# Patient Record
Sex: Female | Born: 1970 | Race: White | Hispanic: No | Marital: Married | Smoking: Never smoker
Health system: Southern US, Community
[De-identification: ages and names within clinical notes are randomized; demographics above are authoritative.]

## PROBLEM LIST (undated history)

## (undated) DIAGNOSIS — R112 Nausea with vomiting, unspecified: Secondary | ICD-10-CM

## (undated) DIAGNOSIS — J45909 Unspecified asthma, uncomplicated: Secondary | ICD-10-CM

## (undated) DIAGNOSIS — Z9889 Other specified postprocedural states: Secondary | ICD-10-CM

## (undated) DIAGNOSIS — C801 Malignant (primary) neoplasm, unspecified: Secondary | ICD-10-CM

## (undated) HISTORY — DX: Nausea with vomiting, unspecified: R11.2

## (undated) HISTORY — DX: Malignant (primary) neoplasm, unspecified: C80.1

## (undated) HISTORY — PX: APPENDECTOMY: SHX54

## (undated) HISTORY — DX: Other specified postprocedural states: Z98.890

## (undated) HISTORY — DX: Unspecified asthma, uncomplicated: J45.909

---

## 1998-05-17 ENCOUNTER — Other Ambulatory Visit: Admission: RE | Admit: 1998-05-17 | Discharge: 1998-05-17 | Payer: Self-pay | Admitting: Obstetrics & Gynecology

## 1999-09-12 ENCOUNTER — Other Ambulatory Visit: Admission: RE | Admit: 1999-09-12 | Discharge: 1999-09-12 | Payer: Self-pay | Admitting: Obstetrics & Gynecology

## 2000-12-10 ENCOUNTER — Other Ambulatory Visit: Admission: RE | Admit: 2000-12-10 | Discharge: 2000-12-10 | Payer: Self-pay | Admitting: Obstetrics and Gynecology

## 2001-01-20 ENCOUNTER — Ambulatory Visit (HOSPITAL_COMMUNITY): Admission: RE | Admit: 2001-01-20 | Discharge: 2001-01-20 | Payer: Self-pay | Admitting: Obstetrics and Gynecology

## 2001-12-30 ENCOUNTER — Other Ambulatory Visit: Admission: RE | Admit: 2001-12-30 | Discharge: 2001-12-30 | Payer: Self-pay | Admitting: Obstetrics and Gynecology

## 2003-07-20 ENCOUNTER — Other Ambulatory Visit: Admission: RE | Admit: 2003-07-20 | Discharge: 2003-07-20 | Payer: Self-pay | Admitting: Obstetrics and Gynecology

## 2004-10-24 ENCOUNTER — Other Ambulatory Visit: Admission: RE | Admit: 2004-10-24 | Discharge: 2004-10-24 | Payer: Self-pay | Admitting: Obstetrics and Gynecology

## 2005-12-17 ENCOUNTER — Other Ambulatory Visit: Admission: RE | Admit: 2005-12-17 | Discharge: 2005-12-17 | Payer: Self-pay | Admitting: Obstetrics and Gynecology

## 2013-11-07 ENCOUNTER — Other Ambulatory Visit: Payer: Self-pay | Admitting: Obstetrics and Gynecology

## 2013-11-07 DIAGNOSIS — R928 Other abnormal and inconclusive findings on diagnostic imaging of breast: Secondary | ICD-10-CM

## 2013-11-21 ENCOUNTER — Ambulatory Visit
Admission: RE | Admit: 2013-11-21 | Discharge: 2013-11-21 | Disposition: A | Discharge status: Home / Self Care | Payer: No Typology Code available for payment source | Source: Ambulatory Visit | Attending: Obstetrics and Gynecology | Admitting: Obstetrics and Gynecology

## 2013-11-21 DIAGNOSIS — R928 Other abnormal and inconclusive findings on diagnostic imaging of breast: Secondary | ICD-10-CM

## 2015-03-03 IMAGING — MG MM DIAGNOSTIC UNILATERAL R
2 series · 2 of 2 positions shown · non-contrast
Comparison: 10/30/2013 and earlier priors

CLINICAL DATA: Possible mass right breast identified on recent
screening mammogram.

EXAM:
DIGITAL DIAGNOSTIC  RIGHT MAMMOGRAM WITH CAD

[R CC]
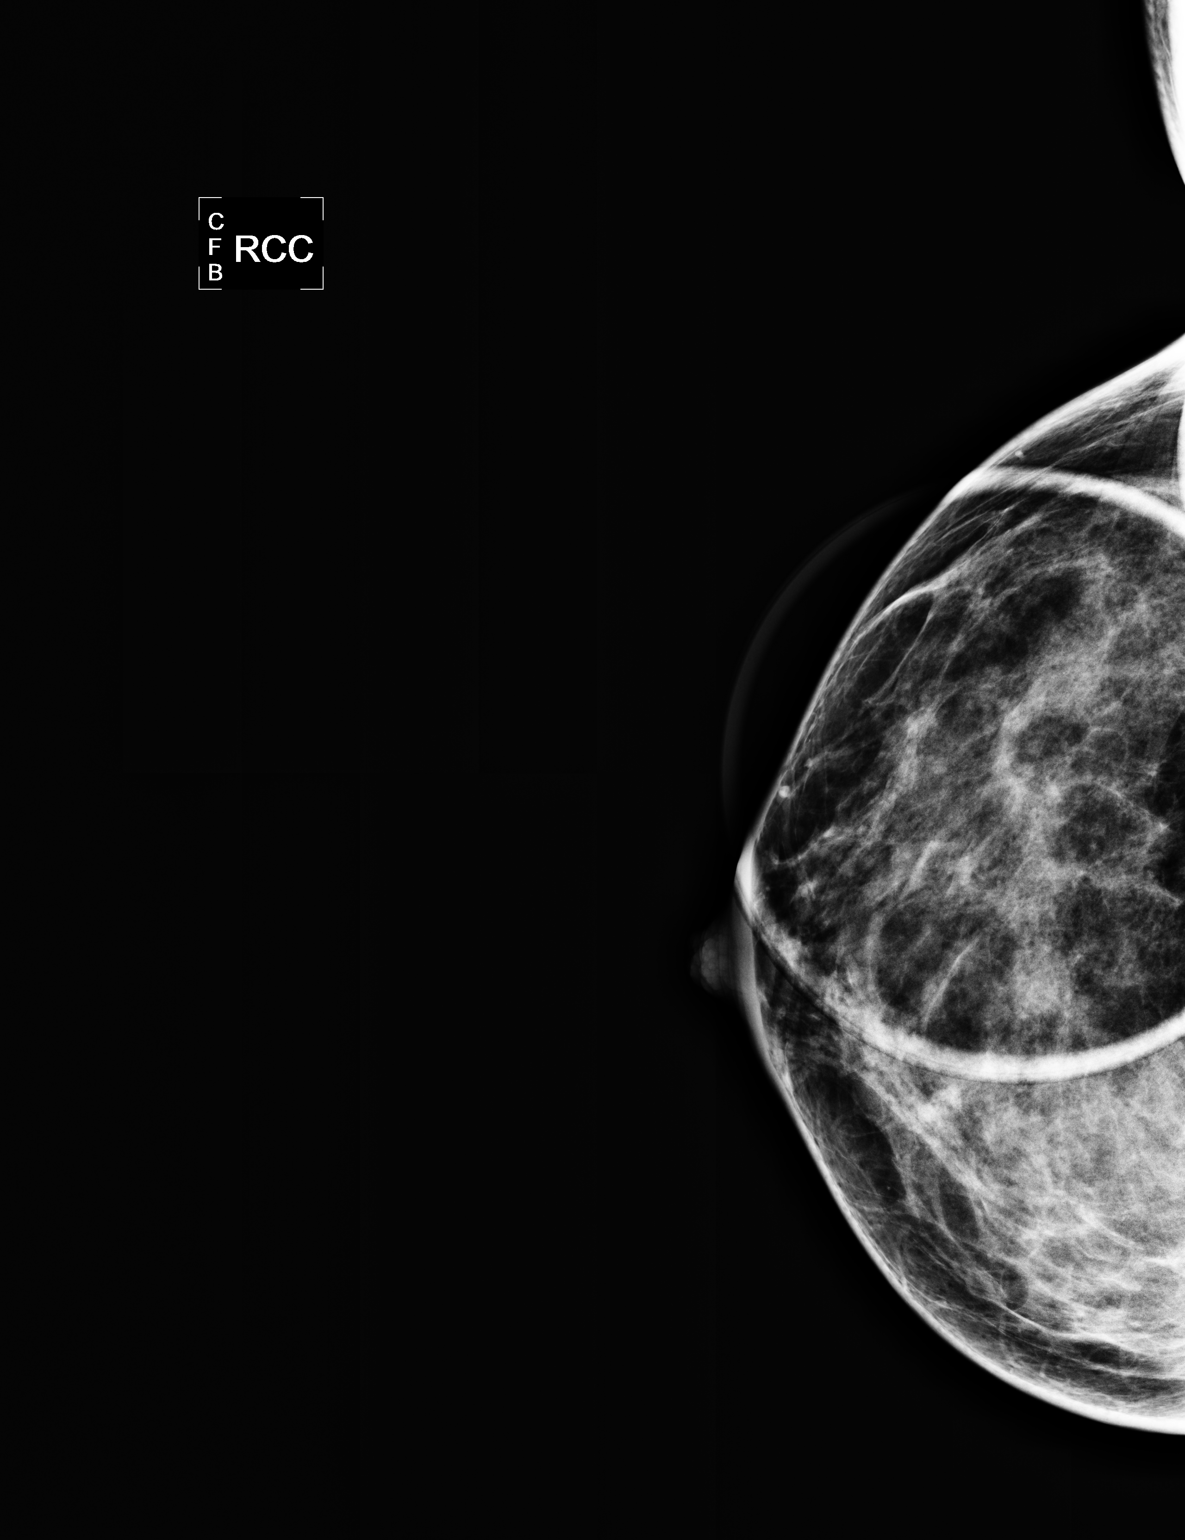

[R MLO]
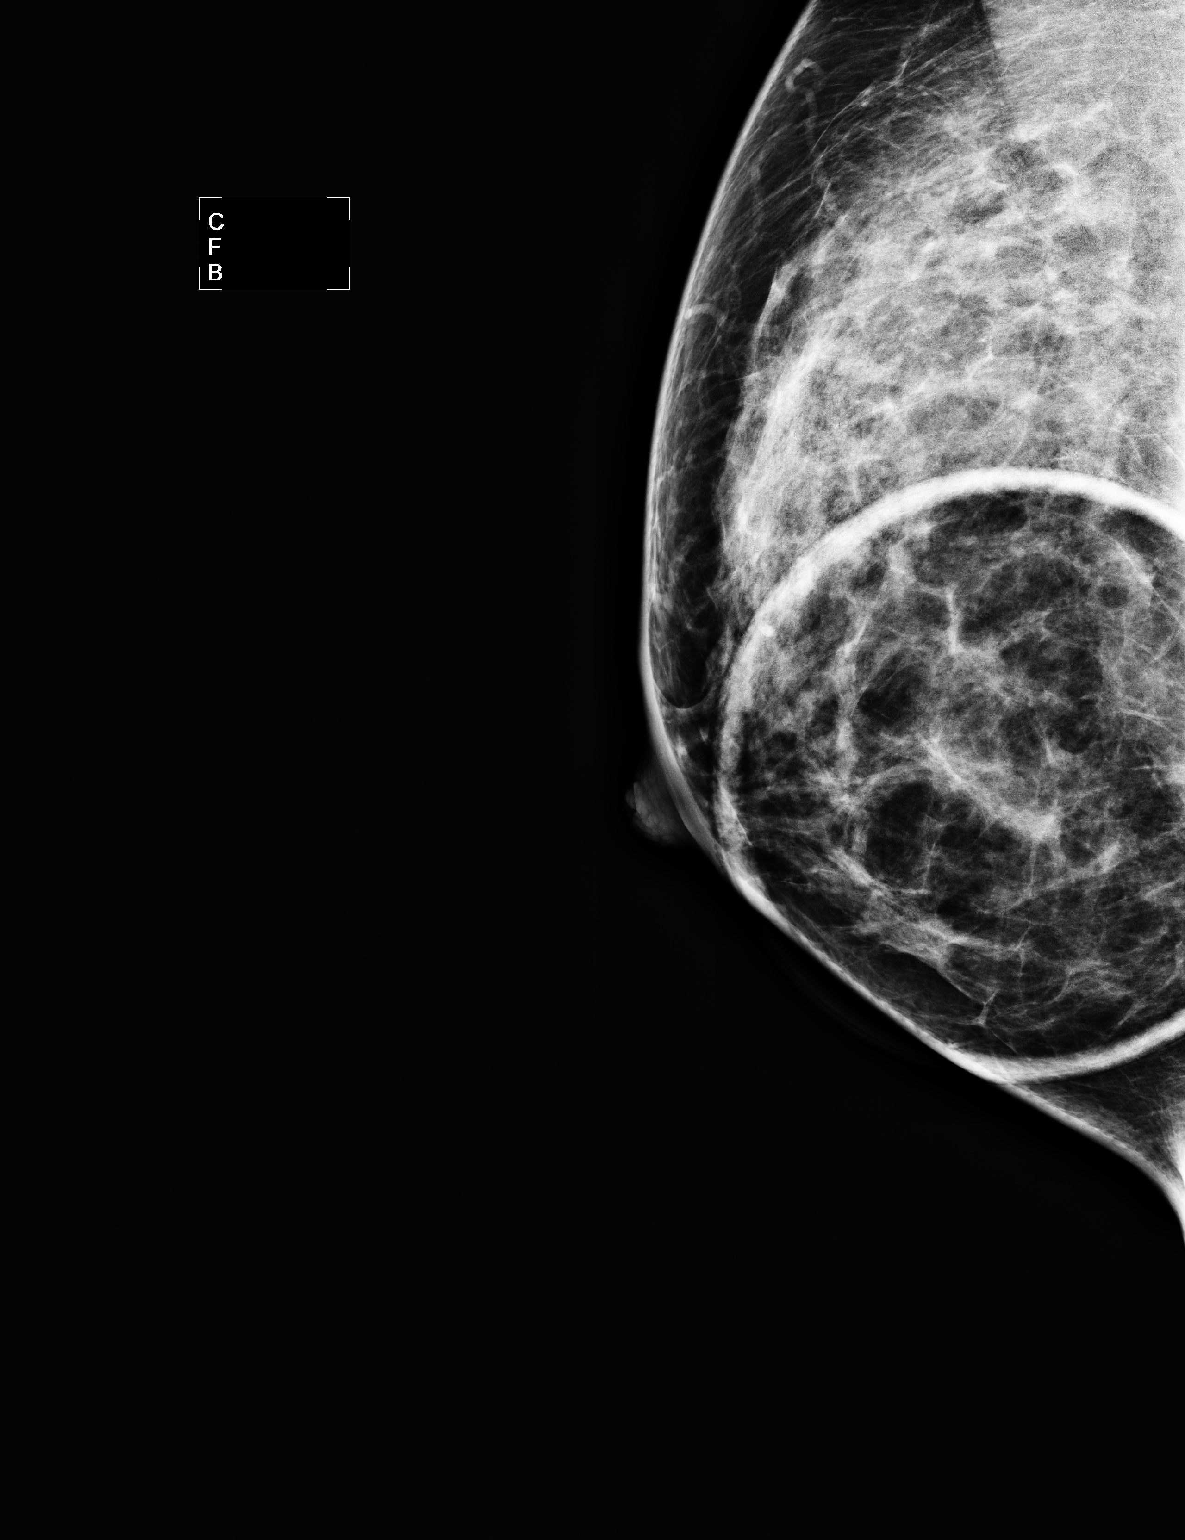

[2 of 2 positions shown; findings below may reference images not displayed]

ACR Breast Density Category c: The breast tissue is heterogeneously
dense, which may obscure small masses.
FINDINGS: Focal spot compression views of the lower outer right breast show
dispersion of fibroglandular breast tissue. There is no evidence of
persistent mass or distortion. The parenchymal pattern in this
region appears similar to prior mammograms.

Mammographic images were processed with CAD.
IMPRESSION: No evidence of malignancy in the right breast.

RECOMMENDATION:
Screening mammogram in one year.(Code:V4-Y-K90)

I have discussed the findings and recommendations with the patient.
Results were also provided in writing at the conclusion of the
visit. If applicable, a reminder letter will be sent to the patient
regarding the next appointment.

BI-RADS CATEGORY  1: Negative

## 2019-10-09 ENCOUNTER — Other Ambulatory Visit: Payer: Self-pay | Admitting: Obstetrics and Gynecology

## 2019-10-09 DIAGNOSIS — R928 Other abnormal and inconclusive findings on diagnostic imaging of breast: Secondary | ICD-10-CM

## 2019-10-13 ENCOUNTER — Ambulatory Visit
Admission: RE | Admit: 2019-10-13 | Discharge: 2019-10-13 | Disposition: A | Discharge status: Home / Self Care | Payer: No Typology Code available for payment source | Source: Ambulatory Visit | Attending: Obstetrics and Gynecology | Admitting: Obstetrics and Gynecology

## 2019-10-13 ENCOUNTER — Other Ambulatory Visit: Payer: Self-pay

## 2019-10-13 DIAGNOSIS — R928 Other abnormal and inconclusive findings on diagnostic imaging of breast: Secondary | ICD-10-CM

## 2020-01-02 ENCOUNTER — Encounter: Payer: Self-pay | Admitting: Gastroenterology

## 2020-01-11 ENCOUNTER — Other Ambulatory Visit: Payer: Self-pay

## 2020-01-11 ENCOUNTER — Ambulatory Visit (AMBULATORY_SURGERY_CENTER): Payer: Self-pay | Admitting: *Deleted

## 2020-01-11 VITALS — Temp 97.5°F | Ht 66.0 in | Wt 144.0 lb

## 2020-01-11 DIAGNOSIS — Z1211 Encounter for screening for malignant neoplasm of colon: Secondary | ICD-10-CM

## 2020-01-11 MED ORDER — CLENPIQ 10-3.5-12 MG-GM -GM/160ML PO SOLN: 1.0000 | ORAL | 0 refills | Status: DC

## 2020-01-11 NOTE — Progress Notes (Signed)
Patient is here in-person for PV. Patient denies any allergies to eggs or soy. Patient denies any problems with anesthesia/sedation. Patient denies any oxygen use at home. Patient denies taking any diet/weight loss medications or blood thinners. Patient is not being treated for MRSA or C-diff.  Pt is aware that care partner will wait in the car during procedure; if they feel like they will be too hot or cold to wait in the car; they may wait in the 4 th floor lobby. Patient is aware to bring only one care partner. We want them to wear a mask (we do not have any that we can provide them), practice social distancing, and we will check their temperatures when they get here.  I did remind the patient that their care partner needs to stay in the parking lot the entire time and have a cell phone available, we will call them when the pt is ready for discharge. Patient will wear mask into building.    Patient has had both vaccines Covid 2nd dose on 12/10/2019.  Clenpiq coupon given to the patient.

## 2020-01-12 ENCOUNTER — Encounter: Payer: No Typology Code available for payment source | Admitting: Gastroenterology

## 2020-01-24 ENCOUNTER — Encounter: Payer: Self-pay | Admitting: Gastroenterology

## 2020-01-26 ENCOUNTER — Encounter: Payer: No Typology Code available for payment source | Admitting: Gastroenterology

## 2020-01-26 ENCOUNTER — Ambulatory Visit (AMBULATORY_SURGERY_CENTER): Payer: No Typology Code available for payment source | Admitting: Gastroenterology

## 2020-01-26 ENCOUNTER — Encounter: Payer: Self-pay | Admitting: Gastroenterology

## 2020-01-26 ENCOUNTER — Other Ambulatory Visit: Payer: Self-pay

## 2020-01-26 VITALS — BP 107/78 | HR 96 | Temp 97.1°F | Resp 20 | Ht 66.0 in | Wt 144.0 lb

## 2020-01-26 DIAGNOSIS — K626 Ulcer of anus and rectum: Secondary | ICD-10-CM | POA: Diagnosis not present

## 2020-01-26 DIAGNOSIS — Z1211 Encounter for screening for malignant neoplasm of colon: Secondary | ICD-10-CM

## 2020-01-26 DIAGNOSIS — D128 Benign neoplasm of rectum: Secondary | ICD-10-CM | POA: Diagnosis not present

## 2020-01-26 DIAGNOSIS — K6289 Other specified diseases of anus and rectum: Secondary | ICD-10-CM | POA: Diagnosis not present

## 2020-01-26 MED ORDER — SODIUM CHLORIDE 0.9 % IV SOLN: 500.0000 mL | Freq: Once | INTRAVENOUS | Status: DC

## 2020-01-26 NOTE — Progress Notes (Signed)
Pt's states no medical or surgical changes since previsit or office visit.  Temp- Bond

## 2020-01-26 NOTE — Progress Notes (Signed)
pt tolerated well. VSS. awake and to recovery. Report given to RN.  

## 2020-01-26 NOTE — Progress Notes (Signed)
Called to room to assist during endoscopic procedure.  Patient ID and intended procedure confirmed with present staff. Received instructions for my participation in the procedure from the performing physician.  

## 2020-01-26 NOTE — Op Note (Signed)
[facility] Patient Name: Alison Daniel Procedure Date: 01/26/2020 1:23 PM MRN: [id number] Endoscopist: Jackquline Denmark , MD Age: 49 Referring MD:  Date of Birth: [birth year] Gender: Female Account #: [id number] Procedure:                Colonoscopy Indications:              Screening for colorectal malignant neoplasm Medicines:                Monitored Anesthesia Care Procedure:                Pre-Anesthesia Assessment:                           - Prior to the procedure, a History and Physical                            was performed, and patient medications and                            allergies were reviewed. The patient's tolerance of                            previous anesthesia was also reviewed. The risks                            and benefits of the procedure and the sedation                            options and risks were discussed with the patient.                            All questions were answered, and informed consent                            was obtained. Prior Anticoagulants: The patient has                            taken no previous anticoagulant or antiplatelet                            agents. ASA Grade Assessment: II - A patient with                            mild systemic disease. After reviewing the risks                            and benefits, the patient was deemed in                            satisfactory condition to undergo the procedure.                           After obtaining informed consent, the colonoscope  was passed under direct vision. Throughout the                            procedure, the patient's blood pressure, pulse, and                            oxygen saturations were monitored continuously. The                            Colonoscope was introduced through the anus and                            advanced to the 4 cm into the ileum. The                            colonoscopy was performed  without difficulty. The                            patient tolerated the procedure well. The quality                            of the bowel preparation was excellent. The                            terminal ileum, ileocecal valve, appendiceal                            orifice, and rectum were photographed. Scope In: 1:43:30 PM Scope Out: 2:05:23 PM Scope Withdrawal Time: 0 hours 15 minutes 57 seconds  Total Procedure Duration: 0 hours 21 minutes 53 seconds  Findings:                 A 8 mm polyp was found in the rectum. The polyp was                            sessile. The polyp was removed with a hot snare.                            Resection and retrieval were complete.                           A few rare small-mouthed diverticula were found in                            the sigmoid colon, descending colon and ascending                            colon.                           2 "kissing" ulcers were noted in the mid rectum, 5                            cm from the dentate line, along  the posterior and                            anterior wall with minor degree of rectal stenosis.                            These ulcers were discrete, superficial and flat.                            The posterior ulcer measured 1.5 cm with clean                            whitish base. The anterior ulcer measured 1 cm. No                            rectal prolapse was noted. No masses.                            Photodocumentation was obtained. The above rectal                            polyp was located just proximal to the stenosis.                           Non-bleeding internal hemorrhoids were found during                            retroflexion. The hemorrhoids were small.                           The terminal ileum appeared normal.                           The exam was otherwise without abnormality on                            direct and retroflexion views. Complications:            No  immediate complications. Estimated Blood Loss:     Estimated blood loss: none. Impression:               -Rectal polyp s/p polypectomy.                           -Mild pancolonic diverticulosis.                           -Mild degree of rectal stenosis with superficial                            ulcers. (? Etiology. Could represent stercoral                            ulcers or solitary rectal ulcers. No masses)                            (  biopsied).                           -Non-bleeding internal hemorrhoids.                           -Otherwise normal colonoscopy to TI. Recommendation:           - Patient has a contact number available for                            emergencies. The signs and symptoms of potential                            delayed complications were discussed with the                            patient. Return to normal activities tomorrow.                            Written discharge instructions were provided to the                            patient.                           - Resume previous diet.                           - Continue present medications.                           - Use Benefiber one teaspoon PO daily.                           - Await pathology results.                           - Repeat colonoscopy for surveillance based on                            pathology results.                           - The findings and recommendations were discussed                            with the patient's husband [name].                           - FU in 74 weeks. Contact numbers were given. I                            have also given my cell No. Jackquline Denmark, MD 01/26/2020 2:21:40 PM This report has been signed electronically.

## 2020-01-26 NOTE — Patient Instructions (Signed)
Handout on polyps, hemorrhoids. Use Benefiber one teaspoon daily.    YOU HAD AN ENDOSCOPIC PROCEDURE TODAY AT [facility]:   Refer to the procedure report that was given to you for any specific questions about what was found during the examination.  If the procedure report does not answer your questions, please call your gastroenterologist to clarify.  If you requested that your care partner not be given the details of your procedure findings, then the procedure report has been included in a sealed envelope for you to review at your convenience later.  YOU SHOULD EXPECT: Some feelings of bloating in the abdomen. Passage of more gas than usual.  Walking can help get rid of the air that was put into your GI tract during the procedure and reduce the bloating. If you had a lower endoscopy (such as a colonoscopy or flexible sigmoidoscopy) you may notice spotting of blood in your stool or on the toilet paper. If you underwent a bowel prep for your procedure, you may not have a normal bowel movement for a few days.  Please Note:  You might notice some irritation and congestion in your nose or some drainage.  This is from the oxygen used during your procedure.  There is no need for concern and it should clear up in a day or so.  SYMPTOMS TO REPORT IMMEDIATELY:   Following lower endoscopy (colonoscopy or flexible sigmoidoscopy):  Excessive amounts of blood in the stool  Significant tenderness or worsening of abdominal pains  Swelling of the abdomen that is new, acute  Fever of 100F or higher   For urgent or emergent issues, a gastroenterologist can be reached at any hour by calling [phone]. Do not use MyChart messaging for urgent concerns.    DIET:  We do recommend a small meal at first, but then you may proceed to your regular diet.  Drink plenty of fluids but you should avoid alcoholic beverages for 24 hours.  ACTIVITY:  You should plan to take it easy for the rest of  today and you should NOT DRIVE or use heavy machinery until tomorrow (because of the sedation medicines used during the test).    FOLLOW UP: Our staff will call the number listed on your records 48-72 hours following your procedure to check on you and address any questions or concerns that you may have regarding the information given to you following your procedure. If we do not reach you, we will leave a message.  We will attempt to reach you two times.  During this call, we will ask if you have developed any symptoms of COVID 19. If you develop any symptoms (ie: fever, flu-like symptoms, shortness of breath, cough etc.) before then, please call [phone].  If you test positive for Covid 19 in the 2 weeks post procedure, please call and report this information to [place].    If any biopsies were taken you will be contacted by phone or by letter within the next 1-3 weeks.  Please call us at [phone] if you have not heard about the biopsies in 3 weeks.    SIGNATURES/CONFIDENTIALITY: You and/or your care partner have signed paperwork which will be entered into your electronic medical record.  These signatures attest to the fact that that the information above on your After Visit Summary has been reviewed and is understood.  Full responsibility of the confidentiality of this discharge information lies with you and/or your care-partner.

## 2020-01-30 ENCOUNTER — Telehealth: Payer: Self-pay | Admitting: *Deleted

## 2020-01-30 NOTE — Telephone Encounter (Signed)
  Follow up Call-  Call back number 01/26/2020  Post procedure Call Back phone  # [phone]  Permission to leave phone message Yes  Some recent data might be hidden     Patient questions:  Do you have a fever, pain , or abdominal swelling? Yes.   Pain Score  0 *  Have you tolerated food without any problems? No. see below  Have you been able to return to your normal activities? Yes.    Do you have any questions about your discharge instructions: Diet   No. Medications  No. Follow up visit  Yes.  answered  Do you have questions or concerns about your Care? No.  Actions: * If pain score is 4 or above: No action needed, pain <4.  1. Have you developed a fever since your procedure? no  2.   Have you had an respiratory symptoms (SOB or cough) since your procedure? no  3.   Have you tested positive for COVID 19 since your procedure no  4.   Have you had any family members/close contacts diagnosed with the COVID 19 since your procedure?  no   If yes to any of these questions please route to Joylene John, RN and Alphonsa Gin, Therapist, sports.    Pt states, "I have been sick on my stomach since last week.  This always happens to me when I switch up my diet like I did."  Denies pain or fever.  Able to keep liquids down.  Advised pt to call back if needed and understanding voiced.

## 2020-02-05 ENCOUNTER — Encounter: Payer: No Typology Code available for payment source | Admitting: Gastroenterology

## 2020-02-06 ENCOUNTER — Encounter: Payer: Self-pay | Admitting: Gastroenterology

## 2020-04-12 ENCOUNTER — Other Ambulatory Visit: Payer: Self-pay

## 2020-04-12 ENCOUNTER — Encounter: Payer: Self-pay | Admitting: Gastroenterology

## 2020-04-12 ENCOUNTER — Ambulatory Visit: Payer: No Typology Code available for payment source | Admitting: Gastroenterology

## 2020-04-12 VITALS — BP 106/72 | HR 73 | Temp 98.0°F | Ht 67.0 in | Wt 141.2 lb

## 2020-04-12 DIAGNOSIS — K626 Ulcer of anus and rectum: Secondary | ICD-10-CM

## 2020-04-12 NOTE — Patient Instructions (Addendum)
Kegel Exercises  Kegel exercises can help strengthen your pelvic floor muscles. The pelvic floor is a group of muscles that support your rectum, small intestine, and bladder. In females, pelvic floor muscles also help support the womb (uterus). These muscles help you control the flow of urine and stool. Kegel exercises are painless and simple, and they do not require any equipment. Your provider may suggest Kegel exercises to:  Improve bladder and bowel control.  Improve sexual response.  Improve weak pelvic floor muscles after surgery to remove the uterus (hysterectomy) or pregnancy (females).  Improve weak pelvic floor muscles after prostate gland removal or surgery (males). Kegel exercises involve squeezing your pelvic floor muscles, which are the same muscles you squeeze when you try to stop the flow of urine or keep from passing gas. The exercises can be done while sitting, standing, or lying down, but it is best to vary your position. Exercises How to do Kegel exercises: 1. Squeeze your pelvic floor muscles tight. You should feel a tight lift in your rectal area. If you are a female, you should also feel a tightness in your vaginal area. Keep your stomach, buttocks, and legs relaxed. 2. Hold the muscles tight for up to 10 seconds. 3. Breathe normally. 4. Relax your muscles. 5. Repeat as told by your health care provider. Repeat this exercise daily as told by your health care provider. Continue to do this exercise for at least 4-6 weeks, or for as long as told by your health care provider. You may be referred to a physical therapist who can help you learn more about how to do Kegel exercises. Depending on your condition, your health care provider may recommend:  Varying how long you squeeze your muscles.  Doing several sets of exercises every day.  Doing exercises for several weeks.  Making Kegel exercises a part of your regular exercise routine. This information is not intended  to replace advice given to you by your health care provider. Make sure you discuss any questions you have with your health care provider. Document Revised: 06/15/2018 Document Reviewed: 06/15/2018 Elsevier Patient Education  2020 [place].   Thank you,  Dr. Jackquline Denmark

## 2020-04-12 NOTE — Progress Notes (Signed)
Chief Complaint:   Referring Provider:  Louretta Shorten, MD      ASSESSMENT AND PLAN;   #1.  Rectal ulcers (neg Bx)-likely stercoral ulcers on colon 01/2020.  No obvious rectal prolapse.   #2.  H/O constipation  Plan: -Discussed kegel exrecises -Continue Benefiber 1 TBS p.o. daily with 8 ounces of water. -I have discussed extensively with the patient.  Have shown her the endoscopic pictures.  I have also gone over biopsy results in detail.  Since she is not having any problems, she would like to hold off on flexible sigmoidoscopy. -She will call us in case of any problems. -FU as needed.  Repeat routine colonoscopy in 10 years.   HPI:    Alison Daniel is a 49 y.o. female  For follow-up visit S/p colonoscopy 01/2020 which showed rectal ulcers with negative biopsies, likely stercoral ulcers.  She had mild pancolonic diverticulosis, benign colonic polyp s/p polypectomy. She has been started on Benefiber thereafter. No complaints at all. No further rectal bleeding She denies having any diarrhea or constipation.  No rectal pain. She is pleased with the progress Would like to hold off on flexible sigmoidoscopy She denies having any history of prolapse.  She did have rectal tear during first childbirth. No urinary or fecal incontinence. Past Medical History:  Diagnosis Date  . Asthma   . Cancer ([place])    skin cancer-basel cell  . Post-operative nausea and vomiting     Past Surgical History:  Procedure Laterality Date  . APPENDECTOMY    . TONSILLECTOMY  1973  . TUBAL LIGATION  1997    Family History  Problem Relation Age of Onset  . Colon polyps Sister   . Colon polyps Sister   . Colon cancer Neg Hx   . Esophageal cancer Neg Hx   . Rectal cancer Neg Hx   . Stomach cancer Neg Hx     Social History   Tobacco Use  . Smoking status: Never Smoker  . Smokeless tobacco: Never Used  Substance Use Topics  . Alcohol use: Yes    Alcohol/week: 3.0 standard drinks   Types: 3 Cans of beer per week  . Drug use: Not Currently    Current Outpatient Medications  Medication Sig Dispense Refill  . ALBUTEROL IN Inhale into the lungs.    . ALPRAZolam (XANAX) 0.25 MG tablet alprazolam 0.25 mg tablet  TAKE 1 TABLET BY MOUTH EVERY DAY AS NEEDED    . tiZANidine (ZANAFLEX) 4 MG tablet Take 4 mg by mouth at bedtime.    Colbert Coyer Min [id number] (MINERALS PO) Take 1 tablet by mouth daily.     Marland Kitchen UNABLE TO FIND Med Name: Vemma daily drink    . UNABLE TO FIND as needed. Med Name: CBD oil po     . VITAMIN D PO Take by mouth daily as needed.      No current facility-administered medications for this visit.    Allergies  Allergen Reactions  . No Known Allergies     Review of Systems:  neg     Physical Exam:    BP 106/72   Pulse 73   Temp 98 F (36.7 C)   Ht 5\' 7"  (1.702 m)   Wt 141 lb 4 oz (64.1 kg)   BMI 22.12 kg/m  Wt Readings from Last 3 Encounters:  04/12/20 141 lb 4 oz (64.1 kg)  01/26/20 144 lb (65.3 kg)  01/11/20 144 lb (65.3 kg)   Limited examination  was normal.    Carmell Austria, MD 04/12/2020, 11:01 AM  Cc: Louretta Shorten, MD

## 2021-01-22 IMAGING — MG DIGITAL DIAGNOSTIC UNILAT RIGHT W/ CAD
3 series · 3 of 3 positions shown · non-contrast
Comparison: October 02, 2019

CLINICAL DATA: 48-year-old patient recalled from recent screening
mammogram for evaluation of possible right breast calcifications.

EXAM:
DIGITAL DIAGNOSTIC RIGHT MAMMOGRAM

[R ML (1 of 2)]
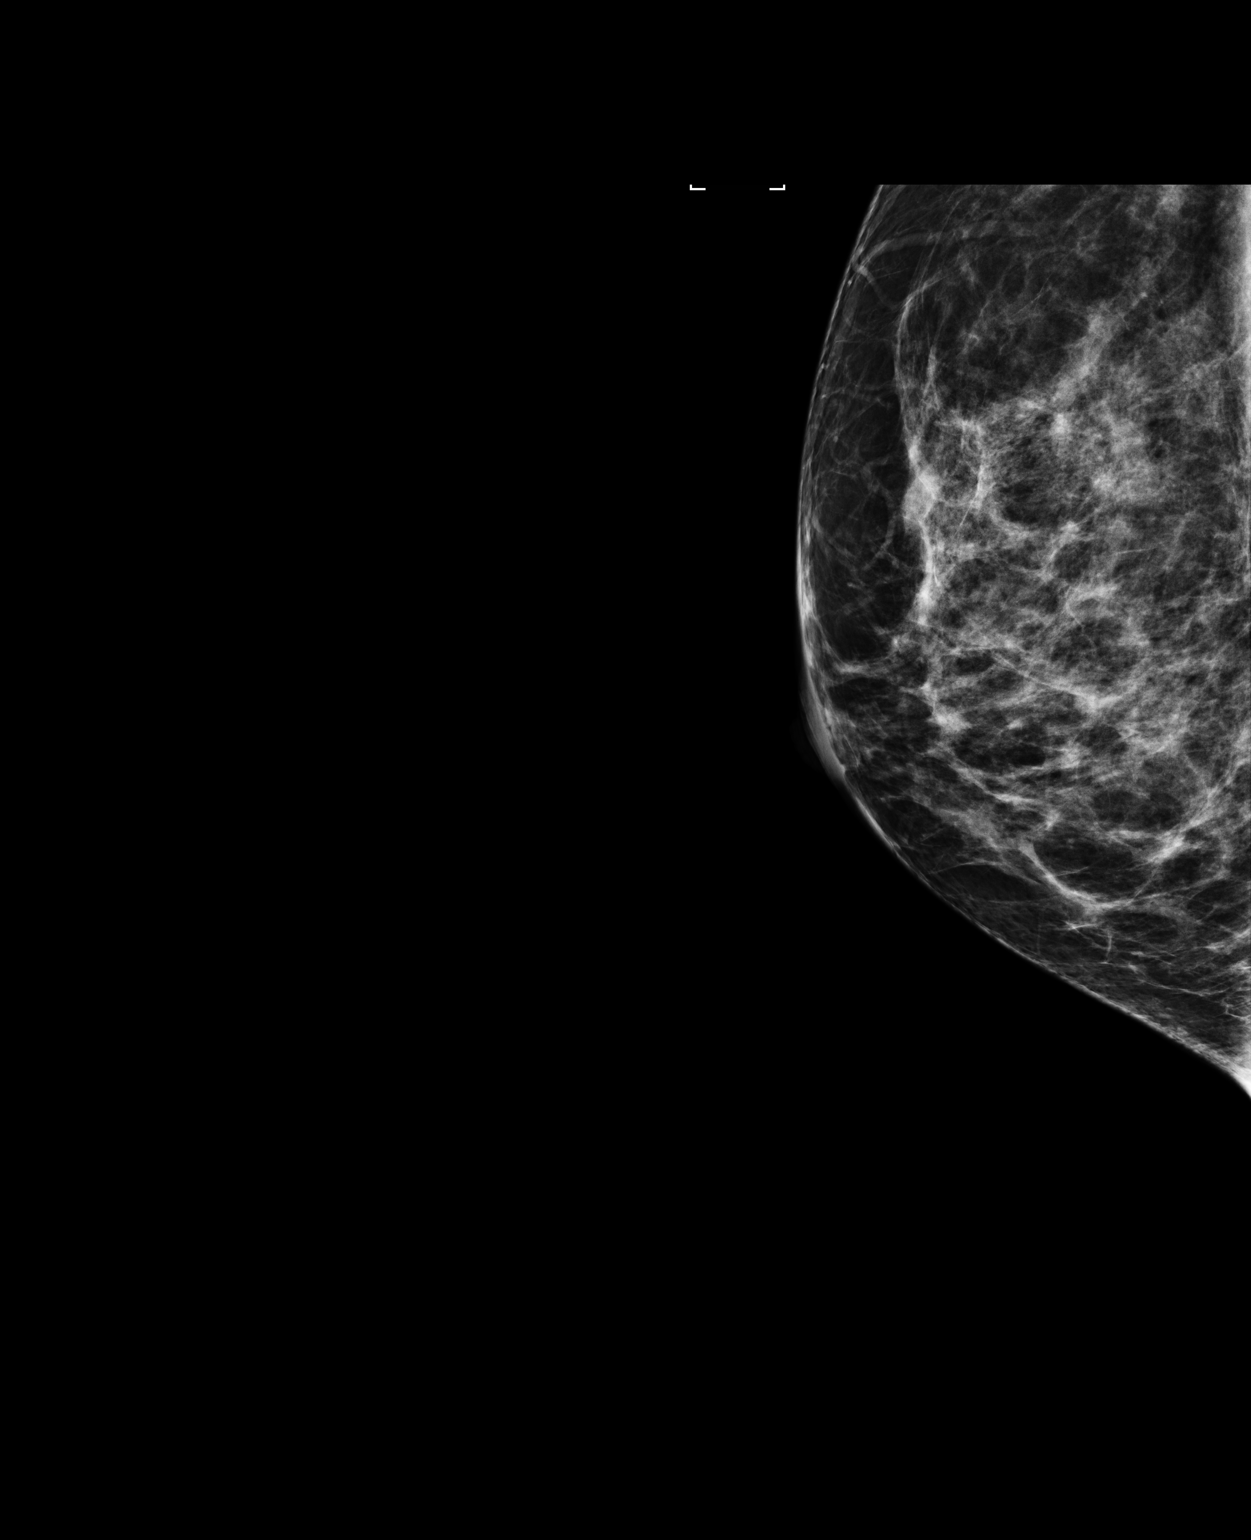

[R CC]
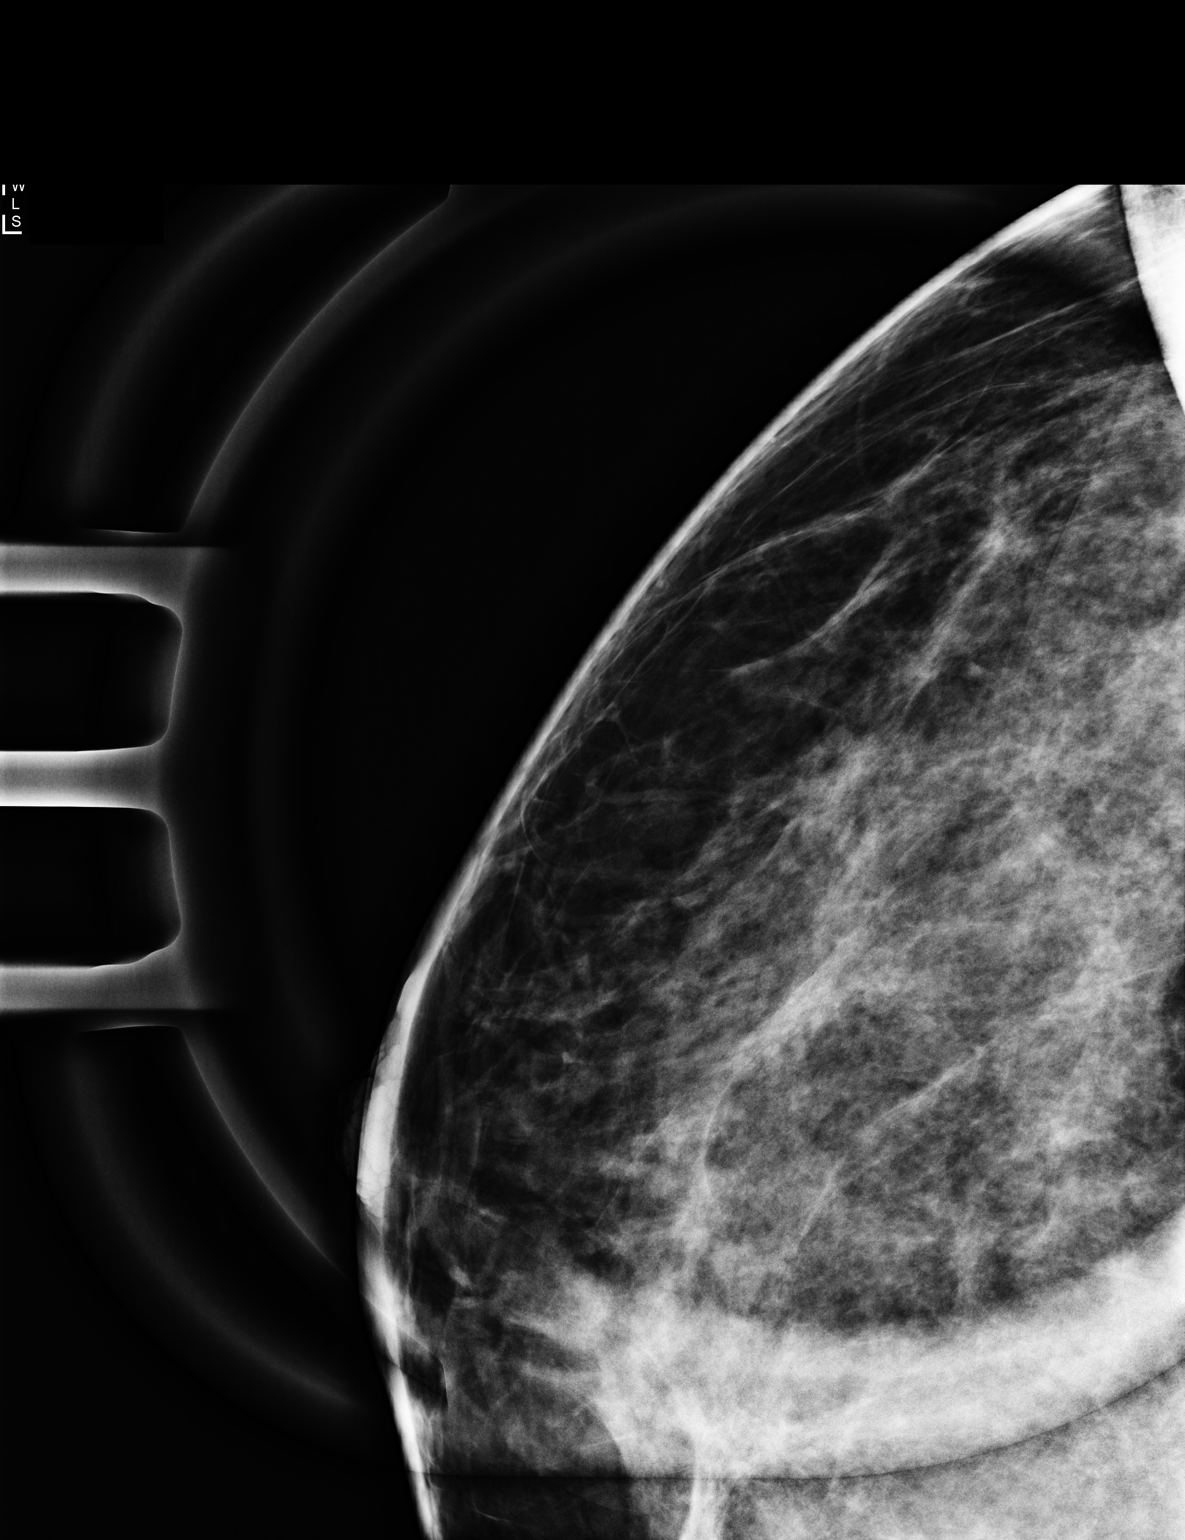

[R ML (2 of 2)]
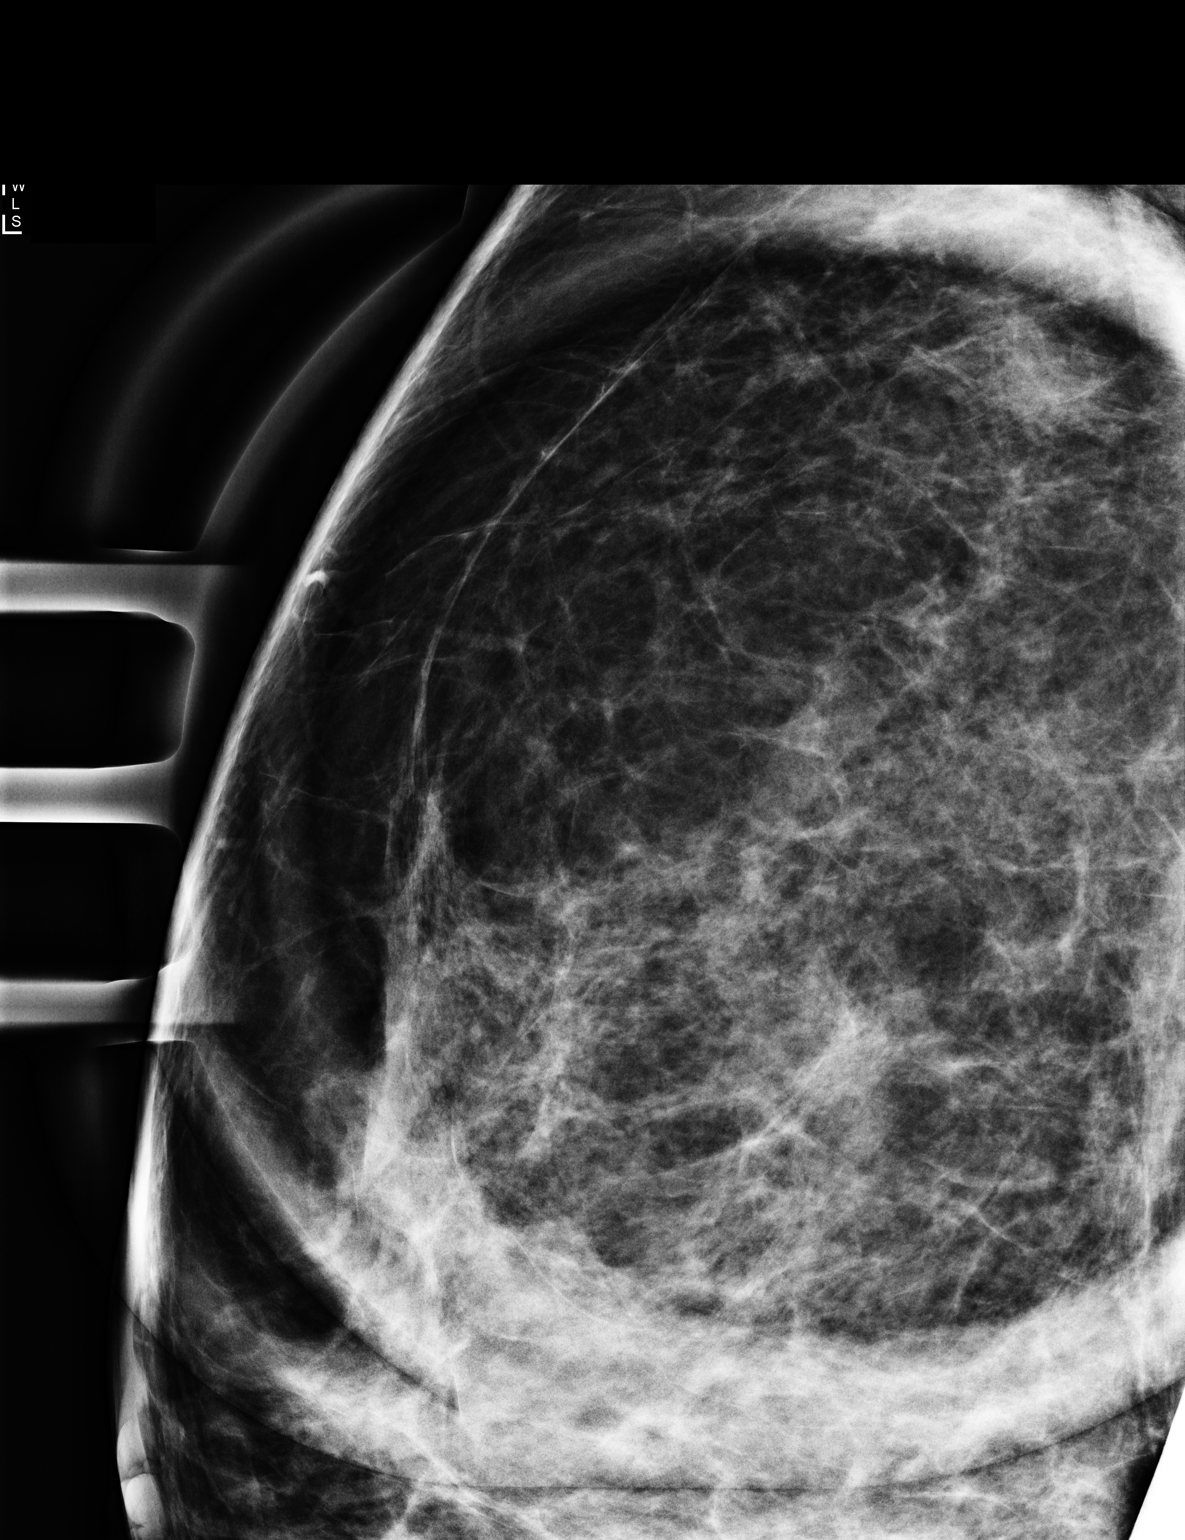

[3 of 3 positions shown; findings below may reference images not displayed]

ACR Breast Density Category c: The breast tissue is heterogeneously
dense, which may obscure small masses.
FINDINGS: Magnification views of the right breast and a 90 degree lateral view
of the right breast show no microcalcifications in the upper-outer
quadrant in the area questioned on the recent screening mammogram.
Possible calcifications seen on the reconstructed 2D images of the
screening mammogram are not confirmed on magnification views. No
suspicious findings.
IMPRESSION: No evidence of malignancy in the right breast. Negative for
microcalcifications in the upper-outer quadrant.

RECOMMENDATION:
Screening mammogram in one year.(Code:GJ-I-T10)

I have discussed the findings and recommendations with the patient.
If applicable, a reminder letter will be sent to the patient
regarding the next appointment.

BI-RADS CATEGORY  1: Negative.

## 2022-10-23 ENCOUNTER — Ambulatory Visit: Payer: BC Managed Care – PPO | Admitting: Gastroenterology

## 2022-10-23 ENCOUNTER — Encounter: Payer: Self-pay | Admitting: Gastroenterology

## 2022-10-23 VITALS — BP 116/68 | HR 78 | Ht 66.0 in | Wt 142.0 lb

## 2022-10-23 DIAGNOSIS — K626 Ulcer of anus and rectum: Secondary | ICD-10-CM

## 2022-10-23 DIAGNOSIS — Z8719 Personal history of other diseases of the digestive system: Secondary | ICD-10-CM | POA: Diagnosis not present

## 2022-10-23 NOTE — Progress Notes (Signed)
    Chief Complaint: FU  Referring Provider:  Maris Berger, MD      ASSESSMENT AND PLAN;   #1. IBS with occ diarrhea. H/O constipation in past.  #2. H/O Rectal ulcers (neg Bx)-likely stercoral ulcers on colon 01/2020.  No obvious rectal prolapse. Bx- neg. Rpt 10 yrs.  Plan:  -Imodoin AD PRN. -I have reassured her. -FU as needed.  Repeat colonoscopy only if any red flag symptoms.  Otherwise routine colonoscopy 01/2030.    HPI:    Alison Daniel is a 51 y.o. female  For follow-up visit  Had frequent stools with mild diarrhea and postprandial urgency without nocturnal symptoms, definitely exacerbated by eating sugary/fried foods and stress.  No melena or hematochezia Negative stool studies including stool for calprotectin, HP stool antigen She had normal CBC, CMP, TSH, celiac screen (except for mildly elevated IgA)  He has been taking as needed psyllium.  Feels much better now.  No weight loss.  S/p colonoscopy 01/2020 which showed rectal ulcers with negative biopsies, likely stercoral ulcers.  She had mild pancolonic diverticulosis, benign colonic polyp s/p polypectomy. Bx- neg   Past Medical History:  Diagnosis Date   Asthma    Cancer ([place])    skin cancer-basel cell   Post-operative nausea and vomiting     Past Surgical History:  Procedure Laterality Date   APPENDECTOMY     TONSILLECTOMY  1973   TUBAL LIGATION  1997    Family History  Problem Relation Age of Onset   Colon polyps Sister    Colon polyps Sister    Colon cancer Neg Hx    Esophageal cancer Neg Hx    Rectal cancer Neg Hx    Stomach cancer Neg Hx     Social History   Tobacco Use   Smoking status: Never   Smokeless tobacco: Never  Vaping Use   Vaping Use: Never used  Substance Use Topics   Alcohol use: Yes    Alcohol/week: 3.0 standard drinks of alcohol    Types: 3 Cans of beer per week   Drug use: Not Currently    Current Outpatient Medications  Medication Sig Dispense Refill    ALBUTEROL IN Inhale into the lungs.     ALPRAZolam (XANAX) 0.25 MG tablet alprazolam 0.25 mg tablet  TAKE 1 TABLET BY MOUTH EVERY DAY AS NEEDED     tiZANidine (ZANAFLEX) 4 MG tablet Take 4 mg by mouth at bedtime.     UNABLE TO FIND Med Name: Vemma daily drink     VITAMIN D PO Take by mouth daily as needed.      No current facility-administered medications for this visit.    Allergies  Allergen Reactions   No Known Allergies     Review of Systems:  neg     Physical Exam:    BP 116/68   Pulse 78   Ht '5\' 6"'$  (1.676 m)   Wt 142 lb (64.4 kg)   SpO2 98%   BMI 22.92 kg/m  Wt Readings from Last 3 Encounters:  10/23/22 142 lb (64.4 kg)  04/12/20 141 lb 4 oz (64.1 kg)  01/26/20 144 lb (65.3 kg)   Gen: awake, alert, NAD HEENT: anicteric, no pallor CV: RRR, no mrg Pulm: CTA b/l Abd: soft, NT/ND, +BS throughout Ext: no c/c/e Neuro: nonfocal  Labs were reviewed with the patient    Carmell Austria, MD 10/23/2022, 10:01 AM  Cc: Maris Berger, MD

## 2022-10-23 NOTE — Patient Instructions (Addendum)
_______________________________________________________  If you are age 53 or older, your body mass index should be between 23-30. Your Body mass index is 22.92 kg/m. If this is out of the aforementioned range listed, please consider follow up with your Primary Care Provider.  If you are age 48 or younger, your body mass index should be between 19-25. Your Body mass index is 22.92 kg/m. If this is out of the aformentioned range listed, please consider follow up with your Primary Care Provider.   ________________________________________________________  The [facility] providers would like to encourage you to use [facility] to communicate with providers for non-urgent requests or questions.  Due to long hold times on the telephone, sending your provider a message by [facility] may be a faster and more efficient way to get a response.  Please allow 48 business hours for a response.  Please remember that this is for non-urgent requests.  _______________________________________________________  Can use imodium as needed  Follow up as needed  Repeat colonoscopy due for 01-2030. Call [phone] 2 months prior to schedule this. A letter will be sent as it gets closer.  Thank you,  Dr. Jackquline Denmark

## 2024-04-27 DIAGNOSIS — Z6822 Body mass index (BMI) 22.0-22.9, adult: Secondary | ICD-10-CM | POA: Diagnosis not present

## 2024-04-27 DIAGNOSIS — N951 Menopausal and female climacteric states: Secondary | ICD-10-CM | POA: Diagnosis not present

## 2024-04-27 DIAGNOSIS — M62830 Muscle spasm of back: Secondary | ICD-10-CM | POA: Diagnosis not present

## 2024-04-27 DIAGNOSIS — Z1231 Encounter for screening mammogram for malignant neoplasm of breast: Secondary | ICD-10-CM | POA: Diagnosis not present

## 2024-04-27 DIAGNOSIS — Z124 Encounter for screening for malignant neoplasm of cervix: Secondary | ICD-10-CM | POA: Diagnosis not present

## 2024-04-27 DIAGNOSIS — Z13820 Encounter for screening for osteoporosis: Secondary | ICD-10-CM | POA: Diagnosis not present

## 2024-04-27 DIAGNOSIS — Z01419 Encounter for gynecological examination (general) (routine) without abnormal findings: Secondary | ICD-10-CM | POA: Diagnosis not present

## 2024-05-26 DIAGNOSIS — N952 Postmenopausal atrophic vaginitis: Secondary | ICD-10-CM | POA: Diagnosis not present

## 2024-05-26 DIAGNOSIS — N951 Menopausal and female climacteric states: Secondary | ICD-10-CM | POA: Diagnosis not present

## 2024-06-02 DIAGNOSIS — Z85828 Personal history of other malignant neoplasm of skin: Secondary | ICD-10-CM | POA: Diagnosis not present

## 2024-06-02 DIAGNOSIS — S60021A Contusion of right index finger without damage to nail, initial encounter: Secondary | ICD-10-CM | POA: Diagnosis not present

## 2024-06-02 DIAGNOSIS — D2272 Melanocytic nevi of left lower limb, including hip: Secondary | ICD-10-CM | POA: Diagnosis not present

## 2024-06-02 DIAGNOSIS — D2261 Melanocytic nevi of right upper limb, including shoulder: Secondary | ICD-10-CM | POA: Diagnosis not present

## 2024-06-02 DIAGNOSIS — D225 Melanocytic nevi of trunk: Secondary | ICD-10-CM | POA: Diagnosis not present

## 2024-06-02 DIAGNOSIS — L814 Other melanin hyperpigmentation: Secondary | ICD-10-CM | POA: Diagnosis not present

## 2024-06-02 DIAGNOSIS — D2262 Melanocytic nevi of left upper limb, including shoulder: Secondary | ICD-10-CM | POA: Diagnosis not present

## 2024-06-16 DIAGNOSIS — I4719 Other supraventricular tachycardia: Secondary | ICD-10-CM | POA: Diagnosis not present

## 2024-06-16 DIAGNOSIS — I491 Atrial premature depolarization: Secondary | ICD-10-CM | POA: Diagnosis not present

## 2024-06-16 DIAGNOSIS — Z8249 Family history of ischemic heart disease and other diseases of the circulatory system: Secondary | ICD-10-CM | POA: Diagnosis not present

## 2024-07-10 DIAGNOSIS — R519 Headache, unspecified: Secondary | ICD-10-CM | POA: Diagnosis not present

## 2024-07-10 DIAGNOSIS — R051 Acute cough: Secondary | ICD-10-CM | POA: Diagnosis not present

## 2024-07-10 DIAGNOSIS — R0981 Nasal congestion: Secondary | ICD-10-CM | POA: Diagnosis not present

## 2024-07-10 DIAGNOSIS — R509 Fever, unspecified: Secondary | ICD-10-CM | POA: Diagnosis not present
